# Patient Record
Sex: Female | Born: 1960 | Race: White | Hispanic: No | Marital: Married
Health system: Southern US, Community
[De-identification: ages and names within clinical notes are randomized; demographics above are authoritative.]

---

## 1998-10-09 ENCOUNTER — Other Ambulatory Visit: Admission: RE | Admit: 1998-10-09 | Discharge: 1998-10-09 | Payer: Self-pay | Admitting: *Deleted

## 2000-02-12 ENCOUNTER — Other Ambulatory Visit: Admission: RE | Admit: 2000-02-12 | Discharge: 2000-02-12 | Payer: Self-pay | Admitting: *Deleted

## 2001-04-19 ENCOUNTER — Other Ambulatory Visit: Admission: RE | Admit: 2001-04-19 | Discharge: 2001-04-19 | Payer: Self-pay | Admitting: *Deleted

## 2002-04-22 ENCOUNTER — Other Ambulatory Visit: Admission: RE | Admit: 2002-04-22 | Discharge: 2002-04-22 | Payer: Self-pay | Admitting: *Deleted

## 2003-04-26 ENCOUNTER — Other Ambulatory Visit: Admission: RE | Admit: 2003-04-26 | Discharge: 2003-04-26 | Payer: Self-pay | Admitting: *Deleted

## 2004-09-24 ENCOUNTER — Encounter: Admission: RE | Admit: 2004-09-24 | Discharge: 2004-09-24 | Payer: Self-pay | Admitting: *Deleted

## 2004-11-26 ENCOUNTER — Other Ambulatory Visit: Admission: RE | Admit: 2004-11-26 | Discharge: 2004-11-26 | Payer: Self-pay | Admitting: *Deleted

## 2006-01-21 ENCOUNTER — Other Ambulatory Visit: Admission: RE | Admit: 2006-01-21 | Discharge: 2006-01-21 | Payer: Self-pay | Admitting: *Deleted

## 2006-08-05 ENCOUNTER — Encounter: Admission: RE | Admit: 2006-08-05 | Discharge: 2006-08-05 | Payer: Self-pay | Admitting: *Deleted

## 2007-02-22 ENCOUNTER — Other Ambulatory Visit: Admission: RE | Admit: 2007-02-22 | Discharge: 2007-02-22 | Payer: Self-pay | Admitting: *Deleted

## 2008-08-15 ENCOUNTER — Encounter: Admission: RE | Admit: 2008-08-15 | Discharge: 2008-08-15 | Payer: Self-pay | Admitting: Family Medicine

## 2009-07-05 ENCOUNTER — Other Ambulatory Visit: Admission: RE | Admit: 2009-07-05 | Discharge: 2009-07-05 | Payer: Self-pay | Admitting: Family Medicine

## 2010-09-17 ENCOUNTER — Inpatient Hospital Stay (HOSPITAL_COMMUNITY): Admission: EM | Admit: 2010-09-17 | Discharge: 2010-09-23 | Payer: Self-pay | Admitting: Emergency Medicine

## 2010-09-19 ENCOUNTER — Encounter (INDEPENDENT_AMBULATORY_CARE_PROVIDER_SITE_OTHER): Payer: Self-pay | Admitting: Internal Medicine

## 2010-11-12 ENCOUNTER — Other Ambulatory Visit
Admission: RE | Admit: 2010-11-12 | Discharge: 2010-11-12 | Payer: Self-pay | Source: Home / Self Care | Admitting: Family Medicine

## 2011-01-20 ENCOUNTER — Other Ambulatory Visit: Payer: Self-pay | Admitting: Family Medicine

## 2011-01-20 DIAGNOSIS — Z1239 Encounter for other screening for malignant neoplasm of breast: Secondary | ICD-10-CM

## 2011-01-20 DIAGNOSIS — Z1231 Encounter for screening mammogram for malignant neoplasm of breast: Secondary | ICD-10-CM

## 2011-01-24 ENCOUNTER — Ambulatory Visit: Payer: Self-pay

## 2011-01-29 ENCOUNTER — Ambulatory Visit
Admission: RE | Admit: 2011-01-29 | Discharge: 2011-01-29 | Disposition: A | Discharge status: Home / Self Care | Payer: BC Managed Care – PPO | Source: Ambulatory Visit | Attending: Family Medicine | Admitting: Family Medicine

## 2011-01-29 DIAGNOSIS — Z1231 Encounter for screening mammogram for malignant neoplasm of breast: Secondary | ICD-10-CM

## 2011-02-12 ENCOUNTER — Other Ambulatory Visit (HOSPITAL_COMMUNITY): Payer: Self-pay | Admitting: Surgery

## 2011-02-14 ENCOUNTER — Other Ambulatory Visit (HOSPITAL_COMMUNITY): Payer: BC Managed Care – PPO

## 2011-03-06 LAB — BASIC METABOLIC PANEL
BUN: 6 mg/dL (ref 6–23)
CO2: 29 mEq/L (ref 19–32)
Calcium: 8.4 mg/dL (ref 8.4–10.5)
Chloride: 104 mEq/L (ref 96–112)
Creatinine, Ser: 0.69 mg/dL (ref 0.4–1.2)
GFR calc Af Amer: >60 mL/min (ref >60)
GFR calc non Af Amer: >60 mL/min (ref >60)
Glucose, Bld: 117 mg/dL — ABNORMAL HIGH (ref 70–99)
Potassium: 3.8 mEq/L (ref 3.5–5.1)
Sodium: 137 mEq/L (ref 135–145)

## 2011-03-06 LAB — CBC
HCT: 28.1 % — ABNORMAL LOW (ref 36.0–46.0)
HCT: 30 % — ABNORMAL LOW (ref 36.0–46.0)
HCT: 30.1 % — ABNORMAL LOW (ref 36.0–46.0)
HCT: 30.2 % — ABNORMAL LOW (ref 36.0–46.0)
HCT: 30.9 % — ABNORMAL LOW (ref 36.0–46.0)
HCT: 35.7 % — ABNORMAL LOW (ref 36.0–46.0)
HCT: 38 % (ref 36.0–46.0)
Hemoglobin: 10.2 g/dL — ABNORMAL LOW (ref 12.0–15.0)
Hemoglobin: 10.3 g/dL — ABNORMAL LOW (ref 12.0–15.0)
Hemoglobin: 9.8 g/dL — ABNORMAL LOW (ref 12.0–15.0)
Hemoglobin: 10.4 g/dL — ABNORMAL LOW (ref 12.0–15.0)
Hemoglobin: 10.7 g/dL — ABNORMAL LOW (ref 12.0–15.0)
Hemoglobin: 12.3 g/dL (ref 12.0–15.0)
Hemoglobin: 13.2 g/dL (ref 12.0–15.0)
MCH: 31.8 pg (ref 26.0–34.0)
MCH: 32.1 pg (ref 26.0–34.0)
MCH: 32.2 pg (ref 26.0–34.0)
MCH: 32.2 pg (ref 26.0–34.0)
MCH: 32.3 pg (ref 26.0–34.0)
MCH: 32.3 pg (ref 26.0–34.0)
MCH: 32.4 pg (ref 26.0–34.0)
MCHC: 34 g/dL (ref 30.0–36.0)
MCHC: 34.2 g/dL (ref 30.0–36.0)
MCHC: 34.9 g/dL (ref 30.0–36.0)
MCHC: 34.4 g/dL (ref 30.0–36.0)
MCHC: 34.4 g/dL (ref 30.0–36.0)
MCHC: 34.5 g/dL (ref 30.0–36.0)
MCHC: 34.9 g/dL (ref 30.0–36.0)
MCV: 92.3 fL (ref 78.0–100.0)
MCV: 93.6 fL (ref 78.0–100.0)
MCV: 94 fL (ref 78.0–100.0)
MCV: 93 fL (ref 78.0–100.0)
MCV: 93.4 fL (ref 78.0–100.0)
MCV: 93.8 fL (ref 78.0–100.0)
MCV: 93.9 fL (ref 78.0–100.0)
Platelets: 195 10*3/uL (ref 150–400)
Platelets: 233 10*3/uL (ref 150–400)
Platelets: 255 10*3/uL (ref 150–400)
Platelets: 184 10*3/uL (ref 150–400)
Platelets: 187 10*3/uL (ref 150–400)
Platelets: 204 10*3/uL (ref 150–400)
Platelets: 223 10*3/uL (ref 150–400)
RBC: 3.05 MIL/uL — ABNORMAL LOW (ref 3.87–5.11)
RBC: 3.19 MIL/uL — ABNORMAL LOW (ref 3.87–5.11)
RBC: 3.21 MIL/uL — ABNORMAL LOW (ref 3.87–5.11)
RBC: 3.21 MIL/uL — ABNORMAL LOW (ref 3.87–5.11)
RBC: 3.3 MIL/uL — ABNORMAL LOW (ref 3.87–5.11)
RBC: 3.83 MIL/uL — ABNORMAL LOW (ref 3.87–5.11)
RBC: 4.08 MIL/uL (ref 3.87–5.11)
RDW: 12.3 % (ref 11.5–15.5)
RDW: 12.3 % (ref 11.5–15.5)
RDW: 12.3 % (ref 11.5–15.5)
RDW: 12 % (ref 11.5–15.5)
RDW: 12.4 % (ref 11.5–15.5)
RDW: 12.4 % (ref 11.5–15.5)
RDW: 12.6 % (ref 11.5–15.5)
WBC: 6.2 10*3/uL (ref 4.0–10.5)
WBC: 7.5 10*3/uL (ref 4.0–10.5)
WBC: 7.9 10*3/uL (ref 4.0–10.5)
WBC: 6.4 10*3/uL (ref 4.0–10.5)
WBC: 7.1 10*3/uL (ref 4.0–10.5)
WBC: 8.7 10*3/uL (ref 4.0–10.5)
WBC: 9.8 10*3/uL (ref 4.0–10.5)

## 2011-03-06 LAB — URINALYSIS, ROUTINE W REFLEX MICROSCOPIC
Bilirubin Urine: NEGATIVE
Glucose, UA: NEGATIVE mg/dL
Hgb urine dipstick: NEGATIVE
Ketones, ur: 40 mg/dL — AB
Nitrite: NEGATIVE
Protein, ur: NEGATIVE mg/dL
Specific Gravity, Urine: 1.022 (ref 1.005–1.030)
Urobilinogen, UA: 0.2 mg/dL (ref 0.0–1.0)
pH: 5.5 (ref 5.0–8.0)

## 2011-03-06 LAB — COMPREHENSIVE METABOLIC PANEL
ALT: 14 U/L (ref 0–35)
AST: 21 U/L (ref 0–37)
Albumin: 3.3 g/dL — ABNORMAL LOW (ref 3.5–5.2)
Alkaline Phosphatase: 43 U/L (ref 39–117)
BUN: 8 mg/dL (ref 6–23)
CO2: 23 mEq/L (ref 19–32)
Calcium: 8.7 mg/dL (ref 8.4–10.5)
Chloride: 102 mEq/L (ref 96–112)
Creatinine, Ser: 0.79 mg/dL (ref 0.4–1.2)
GFR calc Af Amer: >60 mL/min (ref >60)
GFR calc non Af Amer: >60 mL/min (ref >60)
Glucose, Bld: 88 mg/dL (ref 70–99)
Potassium: 4 mEq/L (ref 3.5–5.1)
Sodium: 135 mEq/L (ref 135–145)
Total Bilirubin: 1.2 mg/dL (ref 0.3–1.2)
Total Protein: 6.3 g/dL (ref 6.0–8.3)

## 2011-03-06 LAB — POCT I-STAT, CHEM 8
BUN: 17 mg/dL (ref 6–23)
Calcium, Ion: 1.26 mmol/L (ref 1.12–1.32)
Chloride: 102 mEq/L (ref 96–112)
Creatinine, Ser: 0.7 mg/dL (ref 0.4–1.2)
Glucose, Bld: 97 mg/dL (ref 70–99)
HCT: 40 % (ref 36.0–46.0)
Hemoglobin: 13.6 g/dL (ref 12.0–15.0)
Potassium: 4 mEq/L (ref 3.5–5.1)
Sodium: 136 mEq/L (ref 135–145)
TCO2: 30 mmol/L (ref 0–100)

## 2011-03-06 LAB — TSH: TSH: 4.086 u[IU]/mL (ref 0.350–4.500)

## 2011-03-06 LAB — DIFFERENTIAL
Basophils Absolute: 0 10*3/uL (ref 0.0–0.1)
Basophils Absolute: 0 10*3/uL (ref 0.0–0.1)
Basophils Relative: 0 % (ref 0–1)
Basophils Relative: 1 % (ref 0–1)
Eosinophils Absolute: 0.1 10*3/uL (ref 0.0–0.7)
Eosinophils Absolute: 0.2 10*3/uL (ref 0.0–0.7)
Eosinophils Relative: 1 % (ref 0–5)
Eosinophils Relative: 2 % (ref 0–5)
Lymphocytes Relative: 14 % (ref 12–46)
Lymphocytes Relative: 8 % — ABNORMAL LOW (ref 12–46)
Lymphs Abs: 0.7 10*3/uL (ref 0.7–4.0)
Lymphs Abs: 1 10*3/uL (ref 0.7–4.0)
Monocytes Absolute: 0.5 10*3/uL (ref 0.1–1.0)
Monocytes Absolute: 0.6 10*3/uL (ref 0.1–1.0)
Monocytes Relative: 5 % (ref 3–12)
Monocytes Relative: 8 % (ref 3–12)
Neutro Abs: 5.4 10*3/uL (ref 1.7–7.7)
Neutro Abs: 8.5 10*3/uL — ABNORMAL HIGH (ref 1.7–7.7)
Neutrophils Relative %: 75 % (ref 43–77)
Neutrophils Relative %: 87 % — ABNORMAL HIGH (ref 43–77)

## 2012-04-20 ENCOUNTER — Other Ambulatory Visit: Payer: Self-pay | Admitting: Family Medicine

## 2012-04-20 DIAGNOSIS — Z1231 Encounter for screening mammogram for malignant neoplasm of breast: Secondary | ICD-10-CM

## 2012-04-22 ENCOUNTER — Other Ambulatory Visit: Payer: Self-pay | Admitting: Family Medicine

## 2012-04-22 DIAGNOSIS — R059 Cough, unspecified: Secondary | ICD-10-CM

## 2012-04-22 DIAGNOSIS — R05 Cough: Secondary | ICD-10-CM

## 2012-04-26 ENCOUNTER — Other Ambulatory Visit: Payer: BC Managed Care – PPO

## 2012-05-04 ENCOUNTER — Ambulatory Visit: Payer: BC Managed Care – PPO

## 2012-05-13 ENCOUNTER — Ambulatory Visit
Admission: RE | Admit: 2012-05-13 | Discharge: 2012-05-13 | Disposition: A | Discharge status: Home / Self Care | Payer: BC Managed Care – PPO | Source: Ambulatory Visit | Attending: Family Medicine | Admitting: Family Medicine

## 2012-05-13 DIAGNOSIS — R05 Cough: Secondary | ICD-10-CM

## 2012-05-13 DIAGNOSIS — R059 Cough, unspecified: Secondary | ICD-10-CM

## 2012-05-13 MED ORDER — IOHEXOL 300 MG/ML  SOLN
75.0000 mL | Freq: Once | INTRAMUSCULAR | Status: AC | PRN
  Administered 2012-05-13: 75 mL via INTRAVENOUS

## 2012-05-19 ENCOUNTER — Ambulatory Visit
Admission: RE | Admit: 2012-05-19 | Discharge: 2012-05-19 | Disposition: A | Discharge status: Home / Self Care | Payer: BC Managed Care – PPO | Source: Ambulatory Visit | Attending: Family Medicine | Admitting: Family Medicine

## 2012-05-19 DIAGNOSIS — Z1231 Encounter for screening mammogram for malignant neoplasm of breast: Secondary | ICD-10-CM

## 2013-07-05 ENCOUNTER — Other Ambulatory Visit: Payer: Self-pay

## 2013-07-05 DIAGNOSIS — Z1231 Encounter for screening mammogram for malignant neoplasm of breast: Secondary | ICD-10-CM

## 2013-07-22 ENCOUNTER — Ambulatory Visit: Payer: BC Managed Care – PPO

## 2013-08-05 ENCOUNTER — Ambulatory Visit
Admission: RE | Admit: 2013-08-05 | Discharge: 2013-08-05 | Disposition: A | Discharge status: Home / Self Care | Payer: BC Managed Care – PPO | Source: Ambulatory Visit

## 2013-08-05 DIAGNOSIS — Z1231 Encounter for screening mammogram for malignant neoplasm of breast: Secondary | ICD-10-CM

## 2013-09-01 ENCOUNTER — Other Ambulatory Visit: Payer: Self-pay | Admitting: Family Medicine

## 2013-09-01 ENCOUNTER — Other Ambulatory Visit (HOSPITAL_COMMUNITY)
Admission: RE | Admit: 2013-09-01 | Discharge: 2013-09-01 | Disposition: A | Discharge status: Home / Self Care | Payer: BC Managed Care – PPO | Source: Ambulatory Visit | Attending: Family Medicine | Admitting: Family Medicine

## 2013-09-01 DIAGNOSIS — Z124 Encounter for screening for malignant neoplasm of cervix: Secondary | ICD-10-CM | POA: Insufficient documentation

## 2013-09-01 DIAGNOSIS — Z1151 Encounter for screening for human papillomavirus (HPV): Secondary | ICD-10-CM | POA: Insufficient documentation

## 2016-09-09 ENCOUNTER — Other Ambulatory Visit: Payer: Self-pay | Admitting: Family Medicine

## 2016-09-09 DIAGNOSIS — Z1231 Encounter for screening mammogram for malignant neoplasm of breast: Secondary | ICD-10-CM

## 2016-09-11 ENCOUNTER — Ambulatory Visit: Payer: Self-pay

## 2016-09-23 ENCOUNTER — Ambulatory Visit: Payer: Self-pay

## 2016-10-22 ENCOUNTER — Ambulatory Visit
Admission: RE | Admit: 2016-10-22 | Discharge: 2016-10-22 | Disposition: A | Discharge status: Home / Self Care | Payer: BLUE CROSS/BLUE SHIELD | Source: Ambulatory Visit | Attending: Family Medicine | Admitting: Family Medicine

## 2016-10-22 DIAGNOSIS — Z1231 Encounter for screening mammogram for malignant neoplasm of breast: Secondary | ICD-10-CM

## 2018-09-15 ENCOUNTER — Other Ambulatory Visit: Payer: Self-pay | Admitting: Family Medicine

## 2018-09-15 DIAGNOSIS — Z1231 Encounter for screening mammogram for malignant neoplasm of breast: Secondary | ICD-10-CM

## 2018-10-18 ENCOUNTER — Ambulatory Visit
Admission: RE | Admit: 2018-10-18 | Discharge: 2018-10-18 | Disposition: A | Discharge status: Home / Self Care | Payer: BLUE CROSS/BLUE SHIELD | Source: Ambulatory Visit | Attending: Family Medicine | Admitting: Family Medicine

## 2018-10-18 DIAGNOSIS — Z1231 Encounter for screening mammogram for malignant neoplasm of breast: Secondary | ICD-10-CM

## 2019-08-28 IMAGING — MG DIGITAL SCREENING BILATERAL MAMMOGRAM WITH TOMO AND CAD
8 series · 9 of 24 positions shown · non-contrast
Comparison: Previous exam(s).

CLINICAL DATA: Screening.

EXAM:
DIGITAL SCREENING BILATERAL MAMMOGRAM WITH TOMO AND CAD

[L MLO synth-2D]
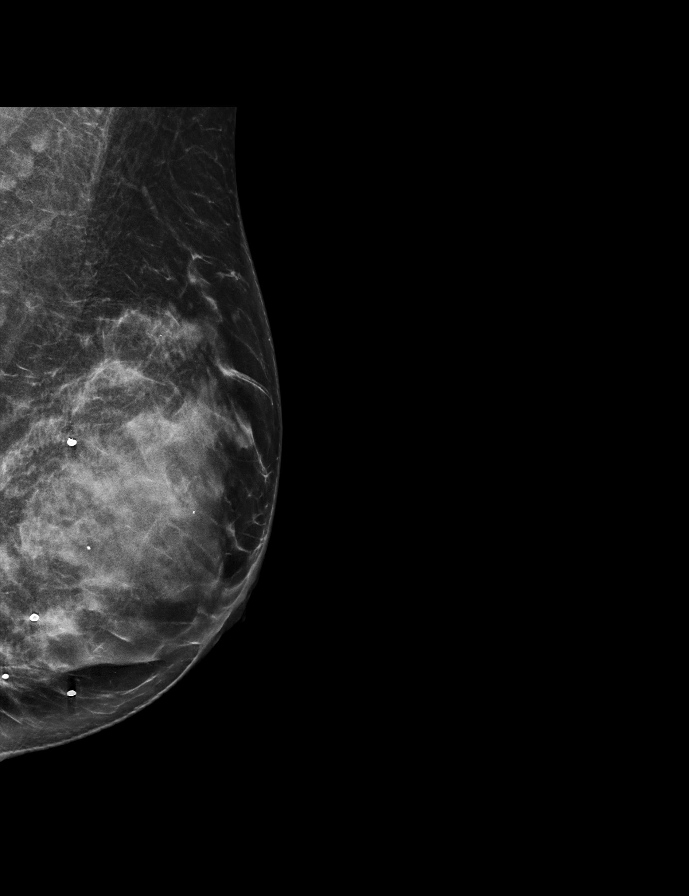

[R MLO synth-2D]
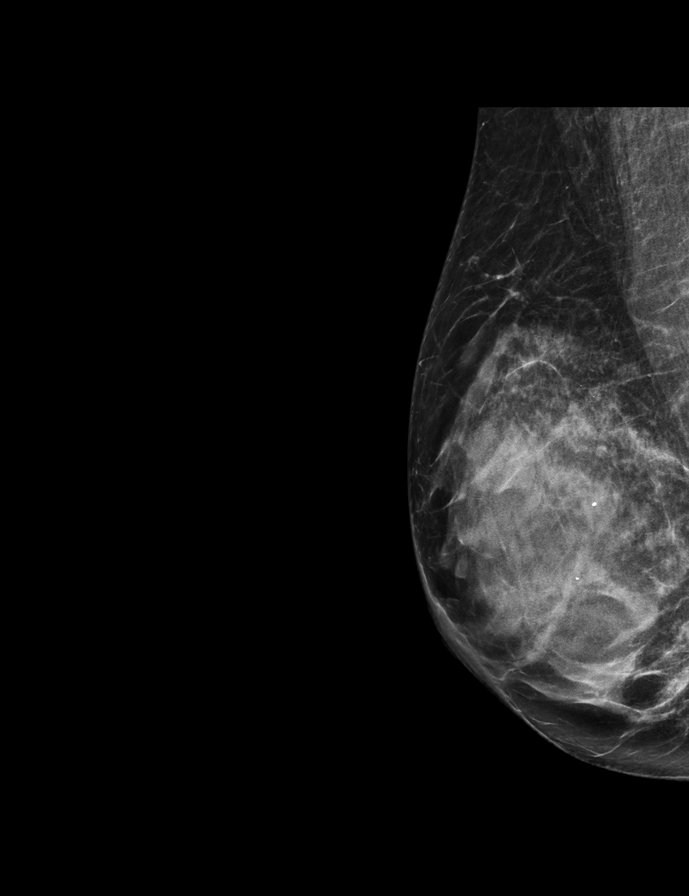

[R CC synth-2D]
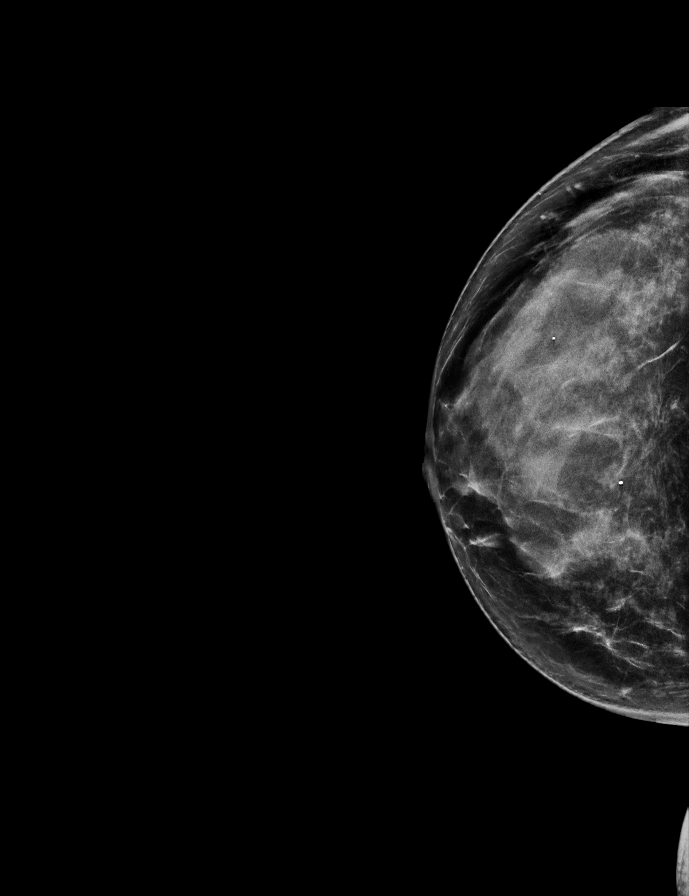

[L CC synth-2D]
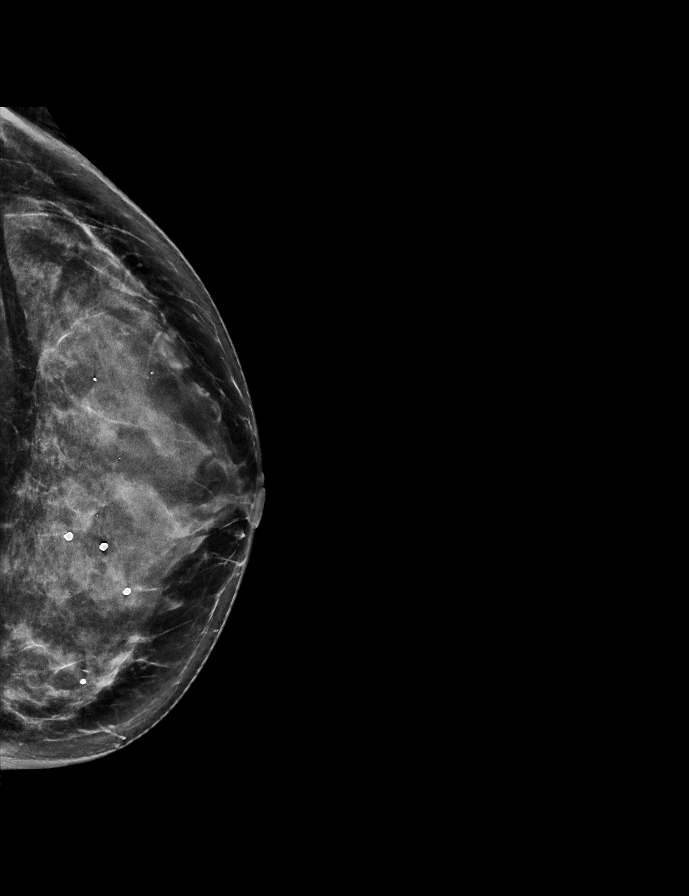

[R CC tomo · 2 of 63 frames shown]
[frame 21/63]
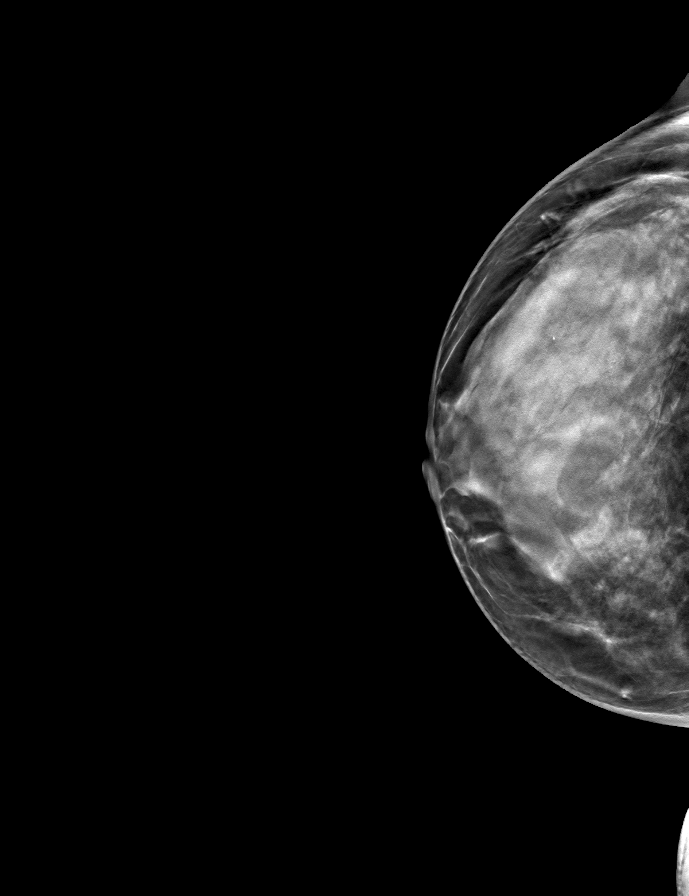
[frame 32/63]
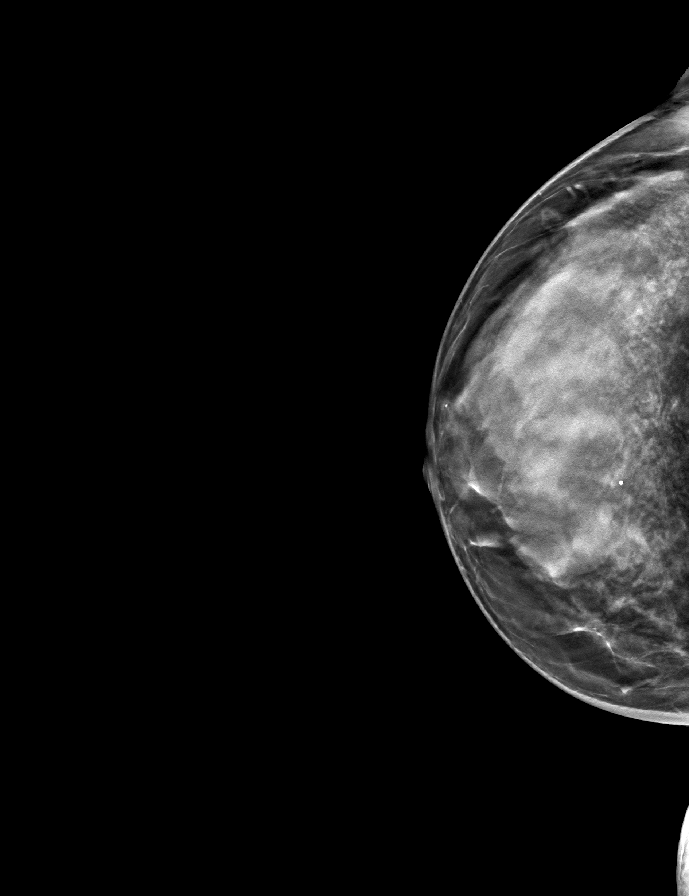

[R MLO tomo · tomo slice 31/62.0]
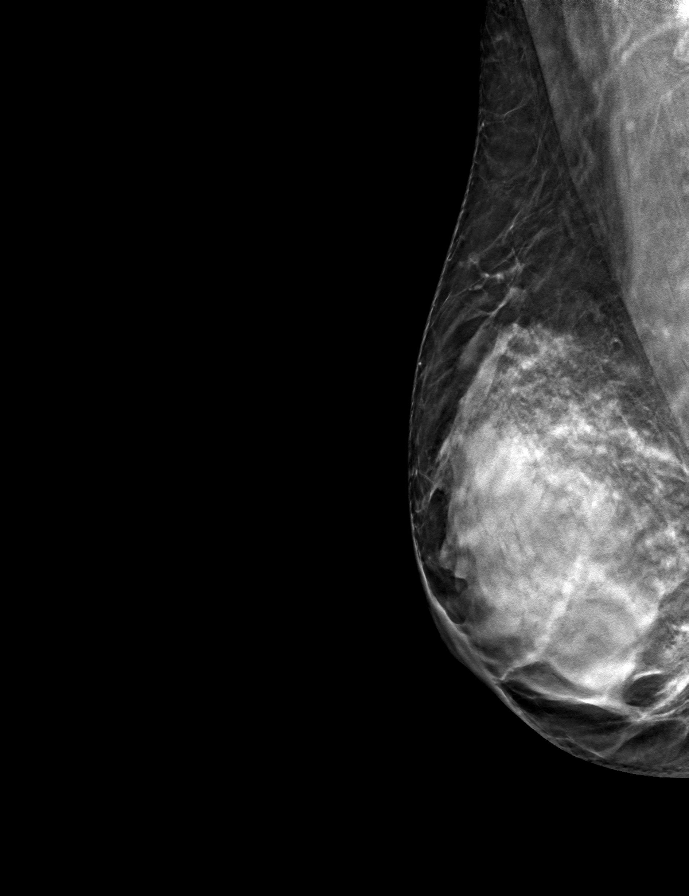

[L CC tomo · tomo slice 33/64.0]
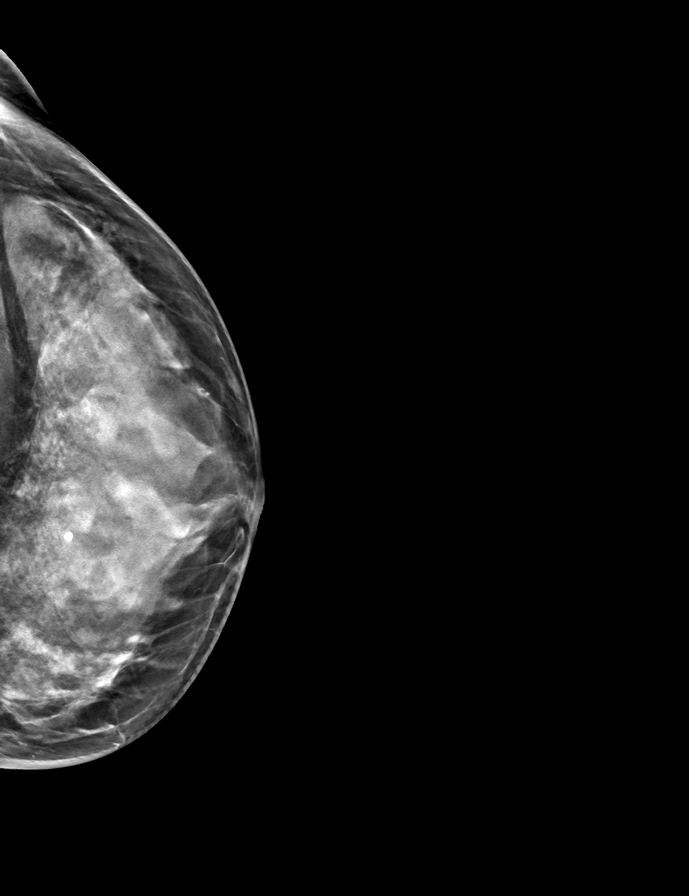

[L MLO tomo · tomo slice 33/64.0]
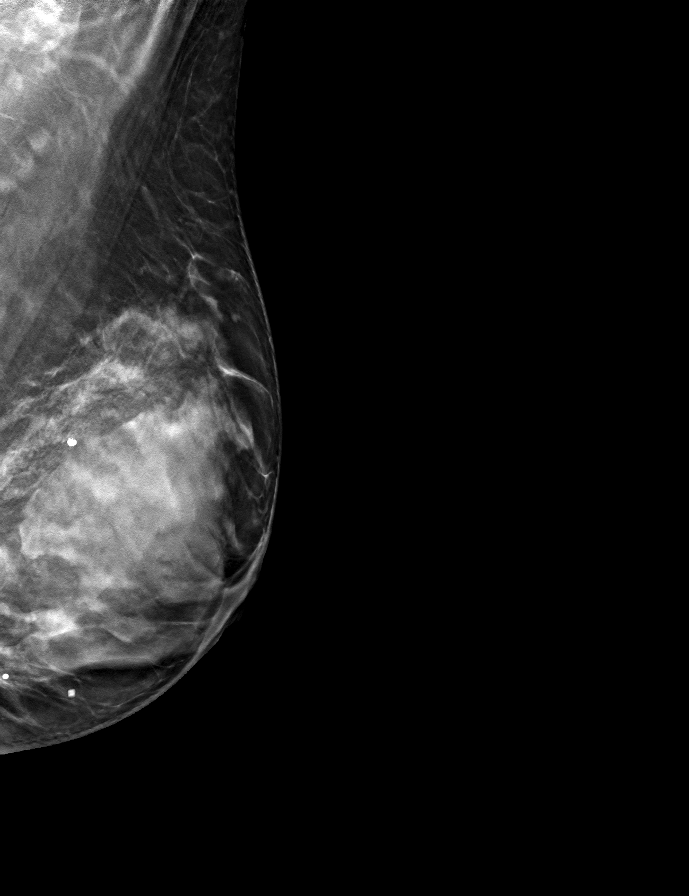

[9 of 24 positions shown; findings below may reference images not displayed]

ACR Breast Density Category d: The breast tissue is extremely dense,
which lowers the sensitivity of mammography.
FINDINGS: There are no findings suspicious for malignancy. Images were
processed with CAD.
IMPRESSION: No mammographic evidence of malignancy. A result letter of this
screening mammogram will be mailed directly to the patient.

RECOMMENDATION:
Screening mammogram in one year. (Code:[id number]-[id number])

BI-RADS CATEGORY  1: Negative.

## 2019-11-24 ENCOUNTER — Other Ambulatory Visit: Payer: Self-pay | Admitting: Family Medicine

## 2019-11-24 DIAGNOSIS — Z1231 Encounter for screening mammogram for malignant neoplasm of breast: Secondary | ICD-10-CM

## 2020-01-13 ENCOUNTER — Other Ambulatory Visit: Payer: Self-pay

## 2020-01-13 ENCOUNTER — Ambulatory Visit
Admission: RE | Admit: 2020-01-13 | Discharge: 2020-01-13 | Disposition: A | Discharge status: Home / Self Care | Payer: BC Managed Care – PPO | Source: Ambulatory Visit | Attending: Family Medicine | Admitting: Family Medicine

## 2020-01-13 DIAGNOSIS — Z1231 Encounter for screening mammogram for malignant neoplasm of breast: Secondary | ICD-10-CM

## 2020-02-27 ENCOUNTER — Ambulatory Visit: Payer: Self-pay | Attending: Internal Medicine

## 2020-02-27 DIAGNOSIS — Z23 Encounter for immunization: Secondary | ICD-10-CM | POA: Insufficient documentation

## 2020-02-27 NOTE — Progress Notes (Signed)
   Covid-19 Vaccination Clinic  Name:  Kristen Stevenson    MRN: [id number] DOB: [birth year]  02/27/2020  [name] was observed post Covid-19 immunization for 15 minutes without incident. She was provided with Vaccine Information Sheet and instruction to access the V-Safe system.   [name] was instructed to call 911 with any severe reactions post vaccine: Marland Kitchen Difficulty breathing  . Swelling of face and throat  . A fast heartbeat  . A bad rash all over body  . Dizziness and weakness   Immunizations Administered    Name Date Dose VIS Date Route   Pfizer COVID-19 Vaccine 02/27/2020  4:20 PM 0.3 mL 12/02/2019 Intramuscular   Manufacturer: [employer], [employer]   Lot: [id number]   NDC: [address]-2   Pfizer COVID-19 Vaccine 02/27/2020  4:34 PM 0.3 mL 12/02/2019 Intramuscular   Manufacturer: [employer]   Lot: [id number]   NDC: [address]-9

## 2020-03-28 ENCOUNTER — Ambulatory Visit: Payer: Self-pay | Attending: Internal Medicine

## 2020-03-28 DIAGNOSIS — Z23 Encounter for immunization: Secondary | ICD-10-CM

## 2020-03-28 NOTE — Progress Notes (Signed)
   Covid-19 Vaccination Clinic  Name:  Kristen Stevenson    MRN: [id number] DOB: December 13, 1961  03/28/2020  Kristen Stevenson was observed post Covid-19 immunization for 15 minutes without incident. She was provided with Vaccine Information Sheet and instruction to access the V-Safe system.   Kristen Stevenson was instructed to call 911 with any severe reactions post vaccine: Marland Kitchen Difficulty breathing  . Swelling of face and throat  . A fast heartbeat  . A bad rash all over body  . Dizziness and weakness   Immunizations Administered    Name Date Dose VIS Date Route   Pfizer COVID-19 Vaccine 03/28/2020  3:17 PM 0.3 mL 12/02/2019 Intramuscular   Manufacturer: [employer]   Lot: [id number]   NDC: [address]-0

## 2020-10-02 ENCOUNTER — Ambulatory Visit: Payer: Self-pay | Attending: Internal Medicine

## 2020-10-02 DIAGNOSIS — Z23 Encounter for immunization: Secondary | ICD-10-CM

## 2020-10-02 NOTE — Progress Notes (Signed)
   Covid-19 Vaccination Clinic  Name:  Kristen Stevenson    MRN: [id number] DOB: 1961-01-12  10/02/2020  [name] was observed post Covid-19 immunization for 15 minutes without incident. She was provided with Vaccine Information Sheet and instruction to access the V-Safe system.   [name] was instructed to call 911 with any severe reactions post vaccine: Marland Kitchen Difficulty breathing  . Swelling of face and throat  . A fast heartbeat  . A bad rash all over body  . Dizziness and weakness
